# Patient Record
Sex: Male | Born: 1970 | Race: Black or African American | Hispanic: No | Marital: Married | Smoking: Never smoker
Health system: Southern US, Community
[De-identification: ages and names within clinical notes are randomized; demographics above are authoritative.]

## PROBLEM LIST (undated history)

## (undated) DIAGNOSIS — K219 Gastro-esophageal reflux disease without esophagitis: Secondary | ICD-10-CM

## (undated) DIAGNOSIS — H9319 Tinnitus, unspecified ear: Secondary | ICD-10-CM

## (undated) DIAGNOSIS — G4730 Sleep apnea, unspecified: Secondary | ICD-10-CM

---

## 2013-09-18 ENCOUNTER — Ambulatory Visit: Payer: Self-pay | Admitting: Emergency Medicine

## 2016-04-21 ENCOUNTER — Emergency Department
Admission: EM | Admit: 2016-04-21 | Discharge: 2016-04-21 | Disposition: A | Discharge status: Home / Self Care | Attending: Emergency Medicine | Admitting: Emergency Medicine

## 2016-04-21 ENCOUNTER — Emergency Department

## 2016-04-21 ENCOUNTER — Encounter: Payer: Self-pay | Admitting: Emergency Medicine

## 2016-04-21 DIAGNOSIS — H9313 Tinnitus, bilateral: Secondary | ICD-10-CM | POA: Diagnosis present

## 2016-04-21 DIAGNOSIS — G9389 Other specified disorders of brain: Secondary | ICD-10-CM | POA: Insufficient documentation

## 2016-04-21 LAB — CBC WITH DIFFERENTIAL/PLATELET
Basophils Absolute: 0.2 10*3/uL — ABNORMAL HIGH (ref 0–0.1)
Basophils Relative: 2 %
Eosinophils Absolute: 0.1 10*3/uL (ref 0–0.7)
Eosinophils Relative: 2 %
HEMATOCRIT: 46.2 % (ref 40.0–52.0)
HEMOGLOBIN: 15.6 g/dL (ref 13.0–18.0)
LYMPHS ABS: 1.7 10*3/uL (ref 1.0–3.6)
Lymphocytes Relative: 19 %
MCH: 30.9 pg (ref 26.0–34.0)
MCHC: 33.8 g/dL (ref 32.0–36.0)
MCV: 91.5 fL (ref 80.0–100.0)
MONOS PCT: 6 %
Monocytes Absolute: 0.5 10*3/uL (ref 0.2–1.0)
NEUTROS ABS: 6.3 10*3/uL (ref 1.4–6.5)
NEUTROS PCT: 71 %
Platelets: 226 10*3/uL (ref 150–440)
RBC: 5.05 MIL/uL (ref 4.40–5.90)
RDW: 13.8 % (ref 11.5–14.5)
WBC: 8.9 10*3/uL (ref 3.8–10.6)

## 2016-04-21 LAB — COMPREHENSIVE METABOLIC PANEL
ALK PHOS: 94 U/L (ref 38–126)
ALT: 32 U/L (ref 17–63)
AST: 29 U/L (ref 15–41)
Albumin: 4.3 g/dL (ref 3.5–5.0)
Anion gap: 7 (ref 5–15)
BILIRUBIN TOTAL: 0.6 mg/dL (ref 0.3–1.2)
BUN: 13 mg/dL (ref 6–20)
CALCIUM: 9.2 mg/dL (ref 8.9–10.3)
CO2: 28 mmol/L (ref 22–32)
Chloride: 103 mmol/L (ref 101–111)
Creatinine, Ser: 1.22 mg/dL (ref 0.61–1.24)
GFR calc non Af Amer: >60 mL/min (ref >60)
Glucose, Bld: 171 mg/dL — ABNORMAL HIGH (ref 65–99)
Potassium: 4.4 mmol/L (ref 3.5–5.1)
Sodium: 138 mmol/L (ref 135–145)
TOTAL PROTEIN: 8.4 g/dL — AB (ref 6.5–8.1)

## 2016-04-21 LAB — ACETAMINOPHEN LEVEL: Acetaminophen (Tylenol), Serum: <10 ug/mL — ABNORMAL LOW (ref 10–30)

## 2016-04-21 LAB — SALICYLATE LEVEL

## 2016-04-21 NOTE — ED Triage Notes (Addendum)
Pt presents to ED with ringing in his ears for the past several weeks. Pt states his ears "feel real sensitive".

## 2016-04-21 NOTE — Discharge Instructions (Signed)
Please call the number provided to schedule an appointment with the specialist. Return to the ER for worsening symptoms, persistent vomiting, difficulty breathing or other concerns.
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
## 2016-05-02 ENCOUNTER — Emergency Department
Admission: EM | Admit: 2016-05-02 | Discharge: 2016-05-02 | Disposition: A | Discharge status: Home / Self Care | Attending: Emergency Medicine | Admitting: Emergency Medicine

## 2016-05-02 ENCOUNTER — Encounter: Payer: Self-pay | Admitting: Emergency Medicine

## 2016-05-02 DIAGNOSIS — J0100 Acute maxillary sinusitis, unspecified: Secondary | ICD-10-CM | POA: Diagnosis not present

## 2016-05-02 DIAGNOSIS — H73891 Other specified disorders of tympanic membrane, right ear: Secondary | ICD-10-CM | POA: Insufficient documentation

## 2016-05-02 DIAGNOSIS — H9313 Tinnitus, bilateral: Secondary | ICD-10-CM

## 2016-05-02 HISTORY — DX: Sleep apnea, unspecified: G47.30

## 2016-05-02 MED ORDER — AMOXICILLIN-POT CLAVULANATE 875-125 MG PO TABS: 1.0000 | ORAL_TABLET | Freq: Two times a day (BID) | ORAL | 0 refills | Status: AC

## 2016-05-02 MED ORDER — AMOXICILLIN-POT CLAVULANATE 875-125 MG PO TABS
1.0000 | ORAL_TABLET | Freq: Once | ORAL | Status: AC
  Administered 2016-05-02: 1 via ORAL
  Filled 2016-05-02: qty 1

## 2016-05-02 MED ORDER — PREDNISONE 10 MG (21) PO TBPK: 10.0000 mg | ORAL_TABLET | Freq: Every day | ORAL | 0 refills | Status: AC

## 2016-05-02 NOTE — ED Triage Notes (Signed)
Patient states that he started having sinus pressure causing ringing in his ears times two weeks. Patient state that he was seen here for the same about a week ago. Patient states that he has been unable to sleep due to the pain. Patient states that he has no improvement with OTC.

## 2016-05-02 NOTE — ED Provider Notes (Signed)
[facility] Emergency Department Provider Note  ____________________________________________  Time seen: Approximately 9:21 PM  I have reviewed the triage vital signs and the nursing notes.   HISTORY  Chief Complaint Facial Pain and Headache    HPI Rickey Stein is a 46 y.o. male presenting to the emergency department with bilateral tinnitus and maxillary and frontal sinus tenderness for the past three weeks. Patient states that his head feels "full". Patient has a history of seasonal allergies. Patient describes tinnitus as constant and "sounding like crickets". He states that intensity of tinnitus changes throughout the day. Patient denies a history of chronic headaches. He denies head trauma and recurrent otitis media. Patient denies taking any medications chronically. He has only been taking over-the-counter NSAIDs. Patient has been afebrile. He denies rhinorrhea or cough. Patient denies nausea, vomiting and vertigo.    Past Medical History:  Diagnosis Date  . Sleep apnea     There are no active problems to display for this patient.   History reviewed. No pertinent surgical history.  Prior to Admission medications   Medication Sig Start Date End Date Taking? Authorizing Provider  amoxicillin-clavulanate (AUGMENTIN) 875-125 MG tablet Take 1 tablet by mouth 2 (two) times daily. 05/02/16 05/12/16  Orvil Feil, PA-C  predniSONE (STERAPRED UNI-PAK 21 TAB) 10 MG (21) TBPK tablet Take 1 tablet (10 mg total) by mouth daily. Take 6 tablets on the first day. Take 5 tablets on the second day. Take 4 tablets on the third day. Take 3 tablets on the fourth day. Take 2 tablets on the fifth day. Take 1 tablet on the 6 day. 05/02/16   Orvil Feil, PA-C    Allergies Patient has no known allergies.  No family history on file.  Social History Social History  Substance Use Topics  . Smoking status: Never Smoker  . Smokeless tobacco: Never Used  . Alcohol use No      Review of Systems  Constitutional: No fever/chills Eyes: No visual changes. No discharge ENT: Patient has maxillary and frontal sinus tenderness. Patient has bilateral tinnitus.  Cardiovascular: no chest pain. Respiratory: no cough. No SOB. Gastrointestinal: No abdominal pain.  No nausea, no vomiting.  No diarrhea.  No constipation. Skin: Negative for rash, abrasions, lacerations, ecchymosis. Neurological: Negative for  focal weakness or numbness. 10-point ROS otherwise negative.  ____________________________________________   PHYSICAL EXAM:  VITAL SIGNS: ED Triage Vitals  Enc Vitals Group     BP 05/02/16 2043 131/78     Pulse Rate 05/02/16 2043 84     Resp 05/02/16 2043 18     Temp 05/02/16 2043 97.9 F (36.6 C)     Temp Source 05/02/16 2043 Oral     SpO2 05/02/16 2043 98 %     Weight 05/02/16 2043 280 lb (127 kg)     Height 05/02/16 2043 5\' 11"  (1.803 m)     Head Circumference --      Peak Flow --      Pain Score 05/02/16 2044 9     Pain Loc --      Pain Edu? --      Excl. in GC? --     Constitutional: Alert and oriented. Patient is talkative and engaged.  Eyes: Palpebral and bulbar conjunctiva are nonerythematous bilaterally. PERRL. EOMI. No scleral icterus bilaterally. Head: Atraumatic. Patient has maxillary and frontal sinus tenderness. ENT:      Ears: Middle ears are grossly effused bilaterally. No evidence of erythema or purulent exudate.  Bony landmarks are visualized bilaterally.      Nose: Skin overlying nares is without erythema. Nasal turbinates are mildly erythematous. Nasal septum is midline.      Mouth/Throat: Mucous membranes are moist. Posterior pharynx is nonerythematous. No tonsillar exudate, hypertrophy or petechiae visualized. Uvula is midline. Neck: Full range of motion. No pain with neck flexion. Hematological/Lymphatic/Immunilogical: No cervical lymphadenopathy.  Cardiovascular:Normal rate, regular rhythm. Normal S1 and S2. No murmurs,  gallops or rubs auscultated.  Respiratory: Trachea is midline. No retractions or presence of deformity.  On auscultation, adventitious sounds are absent.  Neurologic:  Normal for age. No gross focal neurologic deficits are appreciated.  Skin:  Skin is warm, dry and intact. No rash noted.  Psychiatric: Mood and affect are normal for age. Speech and behavior are normal.    ____________________________________________   LABS (all labs ordered are listed, but only abnormal results are displayed)  Labs Reviewed - No data to display ____________________________________________  EKG   ____________________________________________  RADIOLOGY   No results found.  ____________________________________________    PROCEDURES  Procedure(s) performed:    Procedures    Medications  amoxicillin-clavulanate (AUGMENTIN) 875-125 MG per tablet 1 tablet (1 tablet Oral Given 05/02/16 2145)     ____________________________________________   INITIAL IMPRESSION / ASSESSMENT AND PLAN / ED COURSE  Pertinent labs & imaging results that were available during my care of the patient were reviewed by me and considered in my medical decision making (see chart for details).  Review of the [place] CSRS was performed in accordance of the [facility] prior to dispensing any controlled drugs.  Clinical Course    Assessment and Plan:  Tinnitus  Patient presents to the emergency department with bilateral tinnitus. The only potential ototoxic medication that patient has taken is NSAIDs. Patient was advised to discontinue taking NSAIDs of any kind. Patient has middle ear effusion bilaterally on physical exam. In addition, patient has frontal maxillary sinus tenderness. Patient was started on tapered prednisone and augmentin. A combination of chronic sinusitis and middle ear effusion are likely the source of patient's symptoms. Patient was advised to follow-up with otolaryngology in 10 days if tinnitus persists. Vital  signs are reassuring at this time. All patient questions were answered.  ____________________________________________  FINAL CLINICAL IMPRESSION(S) / ED DIAGNOSES  Final diagnoses:  Tinnitus of right ear  Fluid level behind tympanic membrane of right ear  Acute non-recurrent maxillary sinusitis      NEW MEDICATIONS STARTED DURING THIS VISIT:  Discharge Medication List as of 05/02/2016  9:41 PM    START taking these medications   Details  amoxicillin-clavulanate (AUGMENTIN) 875-125 MG tablet Take 1 tablet by mouth 2 (two) times daily., Starting Sun 05/02/2016, Until Wed 05/12/2016, Print    predniSONE (STERAPRED UNI-PAK 21 TAB) 10 MG (21) TBPK tablet Take 1 tablet (10 mg total) by mouth daily. Take 6 tablets on the first day. Take 5 tablets on the second day. Take 4 tablets on the third day. Take 3 tablets on the fourth day. Take 2 tablets on the fifth day. Take 1 tablet on the 6 day., Starting Sun 1 /10/2016, Print            This chart was dictated using voice recognition software/Dragon. Despite best efforts to proofread, errors can occur which can change the meaning. Any change was purely unintentional.    Orvil FeilJaclyn M Woods, PA-C 05/02/16 2205    Governor Rooksebecca Lord, MD 05/05/16 [phone]

## 2016-05-02 NOTE — ED Notes (Signed)
Pt reports headache and ringing in the ears for the past 2 weeks - c/o insomnia - denies nausea/vomiting - denies dizziness - states it is hard to focus

## 2018-03-11 ENCOUNTER — Emergency Department

## 2018-03-11 ENCOUNTER — Other Ambulatory Visit: Payer: Self-pay

## 2018-03-11 ENCOUNTER — Emergency Department
Admission: EM | Admit: 2018-03-11 | Discharge: 2018-03-11 | Disposition: A | Discharge status: Home / Self Care | Attending: Emergency Medicine | Admitting: Emergency Medicine

## 2018-03-11 DIAGNOSIS — K2970 Gastritis, unspecified, without bleeding: Secondary | ICD-10-CM | POA: Diagnosis not present

## 2018-03-11 DIAGNOSIS — R1012 Left upper quadrant pain: Secondary | ICD-10-CM | POA: Diagnosis present

## 2018-03-11 HISTORY — DX: Gastro-esophageal reflux disease without esophagitis: K21.9

## 2018-03-11 HISTORY — DX: Tinnitus, unspecified ear: H93.19

## 2018-03-11 LAB — HEPATIC FUNCTION PANEL
ALT: 238 U/L — ABNORMAL HIGH (ref 0–44)
AST: 327 U/L — AB (ref 15–41)
Albumin: 4.3 g/dL (ref 3.5–5.0)
Alkaline Phosphatase: 109 U/L (ref 38–126)
Bilirubin, Direct: 0.6 mg/dL — ABNORMAL HIGH (ref 0.0–0.2)
Indirect Bilirubin: 1.4 mg/dL — ABNORMAL HIGH (ref 0.3–0.9)
TOTAL PROTEIN: 7.8 g/dL (ref 6.5–8.1)
Total Bilirubin: 2 mg/dL — ABNORMAL HIGH (ref 0.3–1.2)

## 2018-03-11 LAB — CBC
HCT: 44.8 % (ref 39.0–52.0)
Hemoglobin: 15.1 g/dL (ref 13.0–17.0)
MCH: 30.6 pg (ref 26.0–34.0)
MCHC: 33.7 g/dL (ref 30.0–36.0)
MCV: 90.9 fL (ref 80.0–100.0)
Platelets: 230 10*3/uL (ref 150–400)
RBC: 4.93 MIL/uL (ref 4.22–5.81)
RDW: 12.5 % (ref 11.5–15.5)
WBC: 9.3 10*3/uL (ref 4.0–10.5)
nRBC: 0 % (ref 0.0–0.2)

## 2018-03-11 LAB — BASIC METABOLIC PANEL
Anion gap: 9 (ref 5–15)
BUN: 13 mg/dL (ref 6–20)
CALCIUM: 9.3 mg/dL (ref 8.9–10.3)
CO2: 24 mmol/L (ref 22–32)
CREATININE: 1 mg/dL (ref 0.61–1.24)
Chloride: 107 mmol/L (ref 98–111)
GFR calc Af Amer: >60 mL/min (ref >60)
GFR calc non Af Amer: >60 mL/min (ref >60)
GLUCOSE: 110 mg/dL — AB (ref 70–99)
Potassium: 3.8 mmol/L (ref 3.5–5.1)
Sodium: 140 mmol/L (ref 135–145)

## 2018-03-11 LAB — LIPASE, BLOOD: LIPASE: 33 U/L (ref 11–51)

## 2018-03-11 LAB — TROPONIN I: Troponin I: <0.03 ng/mL (ref <0.03)

## 2018-03-11 MED ORDER — SUCRALFATE 1 G PO TABS: 1.0000 g | ORAL_TABLET | Freq: Four times a day (QID) | ORAL | 0 refills | Status: AC

## 2018-03-11 NOTE — Discharge Instructions (Addendum)
Please seek medical attention for any high fevers, chest pain, shortness of breath, change in behavior, persistent vomiting, bloody stool or any other new or concerning symptoms.  

## 2018-03-11 NOTE — ED Triage Notes (Signed)
Pt states ate spicy soup yesterday and is have reflux. Hx of reflux and takes a pill once a day for it. Unsure what it is. A&O, ambulatory.

## 2018-03-11 NOTE — ED Provider Notes (Addendum)
[facility] Emergency Department Provider Note   ____________________________________________   I have reviewed the triage vital signs and the nursing notes.   HISTORY  Chief Complaint Gastroesophageal Reflux   History limited by: Not Limited   HPI Rickey Stein is a 47 y.o. male who presents to the emergency department today because of concerns for bad acid reflux.  The patient states that he has a history of having problems with acid reflux.  He does take omeprazole although takes it somewhat irregularly.  The patient states that he thinks that it might of been some soup that he had last night that made it worse.  The pain is located primarily in his epigastric region.  By the time my exam he states his pain was better and that he taken omeprazole roughly 3 hours prior to my exam.  Patient had some vomiting with this although it was nonbloody.  He has not noticed any bloody or black stool.   Per medical record review patient has a history of acid reflux  Past Medical History:  Diagnosis Date  . Acid reflux   . Sleep apnea   . Tinnitus     There are no active problems to display for this patient.   History reviewed. No pertinent surgical history.  Prior to Admission medications   Medication Sig Start Date End Date Taking? Authorizing Provider  predniSONE (STERAPRED UNI-PAK 21 TAB) 10 MG (21) TBPK tablet Take 1 tablet (10 mg total) by mouth daily. Take 6 tablets on the first day. Take 5 tablets on the second day. Take 4 tablets on the third day. Take 3 tablets on the fourth day. Take 2 tablets on the fifth day. Take 1 tablet on the 6 day. 05/02/16   Orvil Feil, PA-C    Allergies Patient has no known allergies.  History reviewed. No pertinent family history.  Social History Social History   Tobacco Use  . Smoking status: Never Smoker  . Smokeless tobacco: Never Used  Substance Use Topics  . Alcohol use: No  . Drug use: No    Review of  Systems Constitutional: No fever/chills Eyes: No visual changes. ENT: No sore throat. Cardiovascular: Denies chest pain. Respiratory: Denies shortness of breath. Gastrointestinal: Positive for abdominal pain, vomiting.  Genitourinary: Negative for dysuria. Musculoskeletal: Negative for back pain. Skin: Negative for rash. Neurological: Negative for headaches, focal weakness or numbness.  ____________________________________________   PHYSICAL EXAM:  VITAL SIGNS: ED Triage Vitals  Enc Vitals Group     BP 03/11/18 1718 133/88     Pulse Rate 03/11/18 1716 75     Resp 03/11/18 1716 16     Temp 03/11/18 1716 98.6 F (37 C)     Temp Source 03/11/18 1716 Oral     SpO2 03/11/18 1716 96 %     Weight 03/11/18 1717 275 lb (124.7 kg)     Height 03/11/18 1717 5\' 11"  (1.803 m)     Head Circumference --      Peak Flow --      Pain Score 03/11/18 1717 9   Constitutional: Alert and oriented.  Eyes: Conjunctivae are normal.  ENT      Head: Normocephalic and atraumatic.      Nose: No congestion/rhinnorhea.      Mouth/Throat: Mucous membranes are moist.      Neck: No stridor. Hematological/Lymphatic/Immunilogical: No cervical lymphadenopathy. Cardiovascular: Normal rate, regular rhythm.  No murmurs, rubs, or gallops.  Respiratory: Normal respiratory effort without tachypnea  nor retractions. Breath sounds are clear and equal bilaterally. No wheezes/rales/rhonchi. Gastrointestinal: Soft and minimally tender in the epigastric and left upper quadrants.  Genitourinary: Deferred Musculoskeletal: Normal range of motion in all extremities. No lower extremity edema. Neurologic:  Normal speech and language. No gross focal neurologic deficits are appreciated.  Skin:  Skin is warm, dry and intact. No rash noted. Psychiatric: Mood and affect are normal. Speech and behavior are normal. Patient exhibits appropriate insight and judgment.  ____________________________________________    LABS  (pertinent positives/negatives)  Trop <0.03 CBC wbc 9.3, hgb 15.1, plt 230 BMP wnl except glu 110  ____________________________________________   EKG  I, Phineas SemenGraydon Raidon Swanner, attending physician, personally viewed and interpreted this EKG  EKG Time: 1721 Rate: 75 Rhythm: normal sinus rhythm Axis: normal Intervals: qtc 410 QRS: narrow ST changes: no st elevation Impression: normal ekg   ____________________________________________    RADIOLOGY  CXR No acute disease  ____________________________________________   PROCEDURES  Procedures  ____________________________________________   INITIAL IMPRESSION / ASSESSMENT AND PLAN / ED COURSE  Pertinent labs & imaging results that were available during my care of the patient were reviewed by me and considered in my medical decision making (see chart for details).   Patient presented to the emergency department today because of concerns for possible acid reflux.  On exam patient did have some tenderness in the epigastric region.  Blood work without concerning leukocytosis.  Patient's pain had resolved by the time my exam.  He was however slightly tender in the epigastric and left upper quadrants.  At this point I think acid reflux likely. Discussed this with the patient. Will plan on discharging with sucralfate.  LFTs resulted after patient was discharged. I was able to contact the patient over the phone and relayed the results. Discussed with patient importance of LFT recheck with PCP and possible gallbladder US if symptoms continue.  ____________________________________________   FINAL CLINICAL IMPRESSION(S) / ED DIAGNOSES  Final diagnoses:  Gastritis, presence of bleeding unspecified, unspecified chronicity, unspecified gastritis type     Note: This dictation was prepared with Dragon dictation. Any transcriptional errors that result from this process are unintentional     Phineas SemenGoodman, Lamoine Fredricksen, MD 03/11/18 2050     Phineas SemenGoodman, Sascha Baugher, MD 03/11/18 2211

## 2018-07-27 IMAGING — CT CT HEAD W/O CM
3 series · 16 of 47 positions shown, 19 images · non-contrast
Comparison: Head CT 09/18/2013

CLINICAL DATA: Tinnitus

EXAM:
CT HEAD WITHOUT CONTRAST
TECHNIQUE: Contiguous axial images were obtained from the base of the skull
through the vertex without intravenous contrast.

[Series 3: head wo · axial · 0.45mm/px · z∈[-89,+36]mm · 10 of 31 slices shown, 13 images]
[im 3/31  brain]
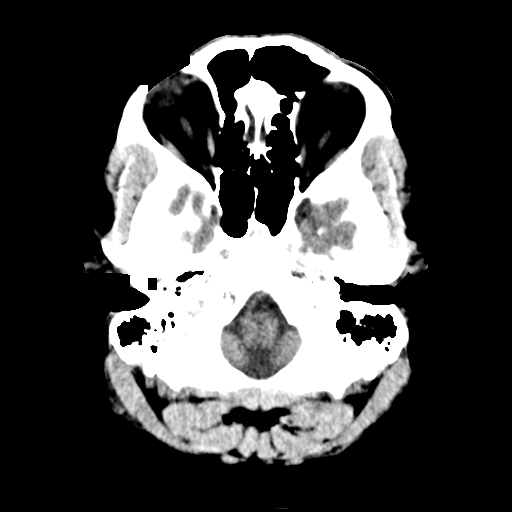
[im 3/31  bone]
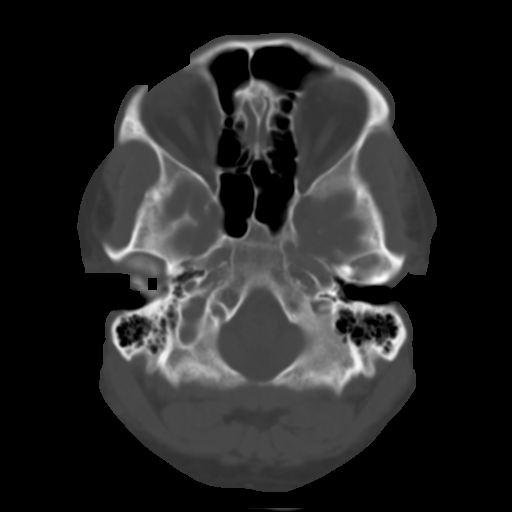
[im 6/31  brain]
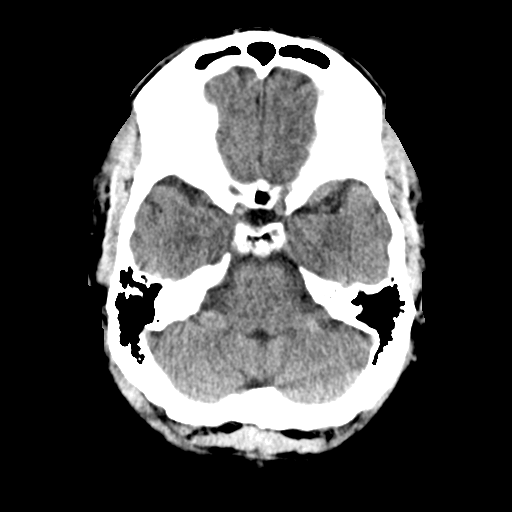
[im 9/31  brain]
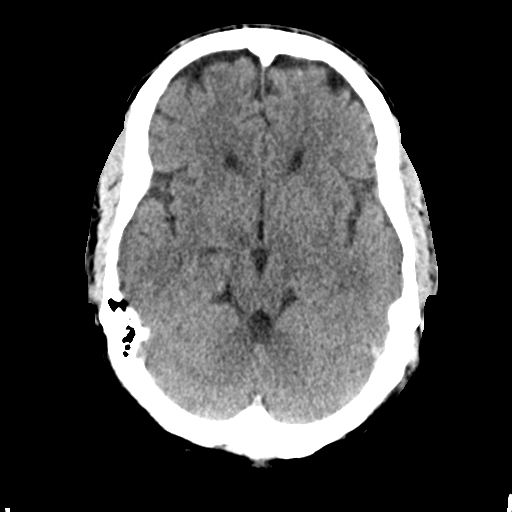
[im 11/31  brain]
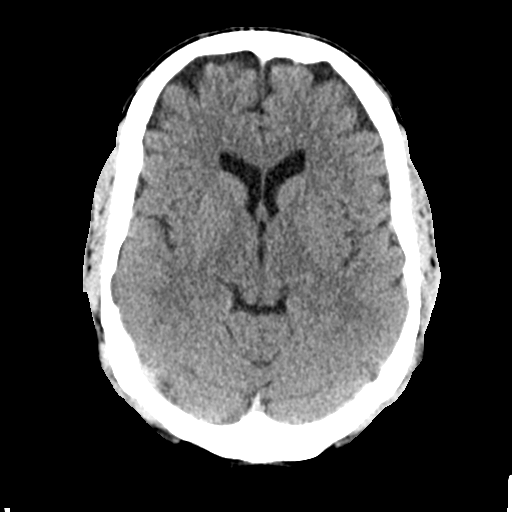
[im 14/31  brain]
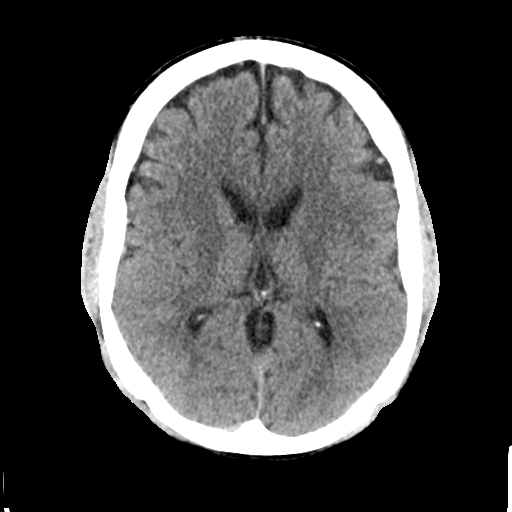
[im 14/31  bone]
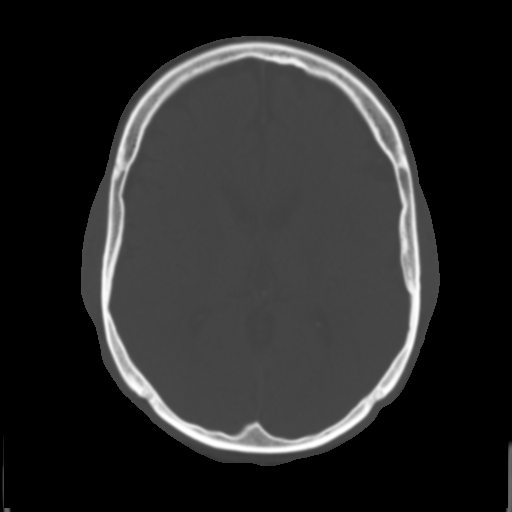
[im 17/31  brain]
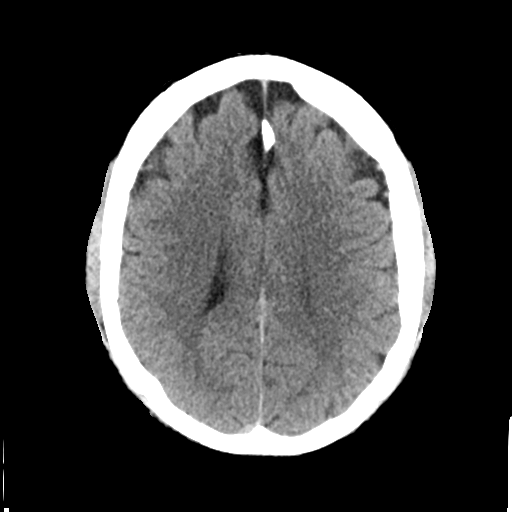
[im 20/31  brain]
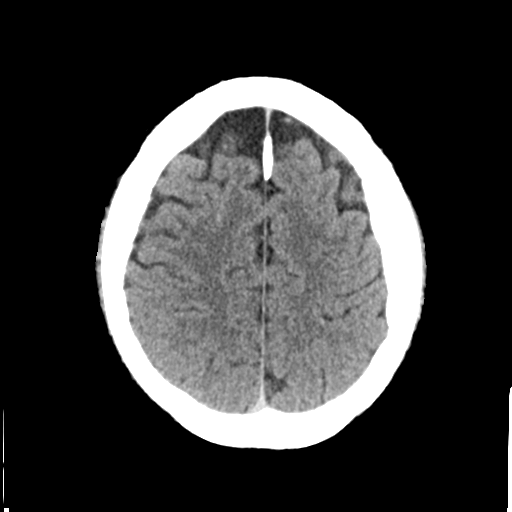
[im 23/31  brain]
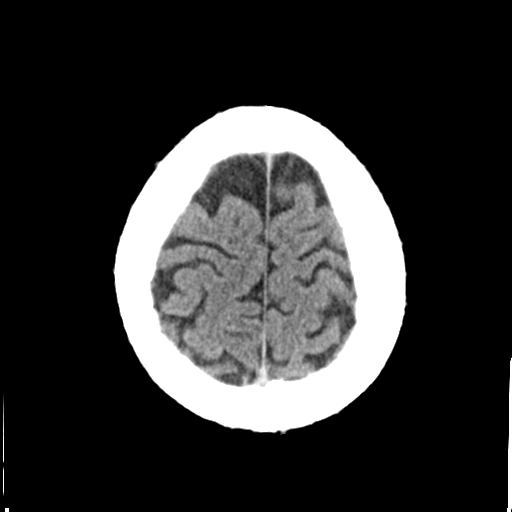
[im 25/31  brain]
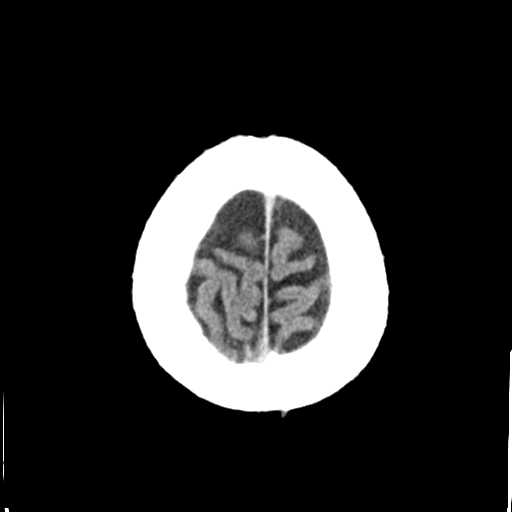
[im 25/31  bone]
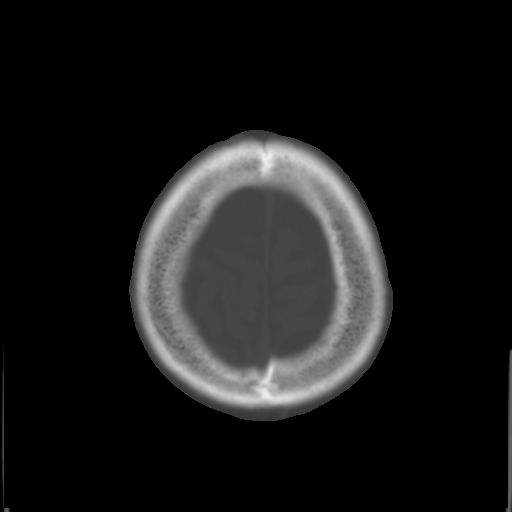
[im 28/31  brain]
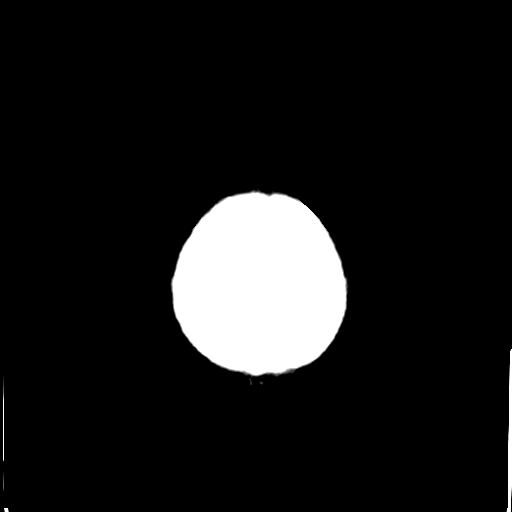

[Series 4: coronal soft tissue · coronal · 0.31mm/px · 3 of 67 slices shown]
[im 23/67  brain]
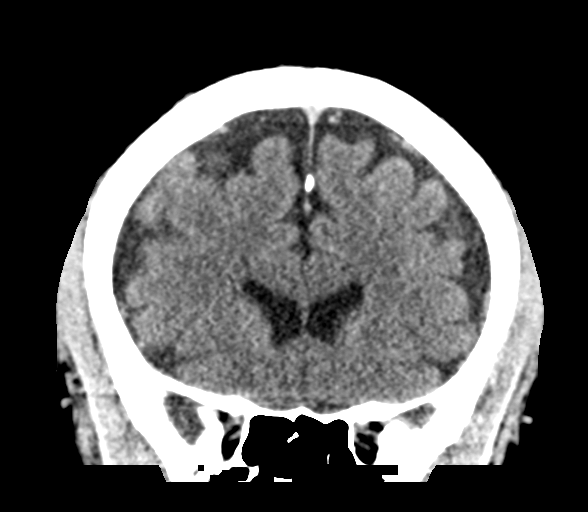
[im 30/67  brain]
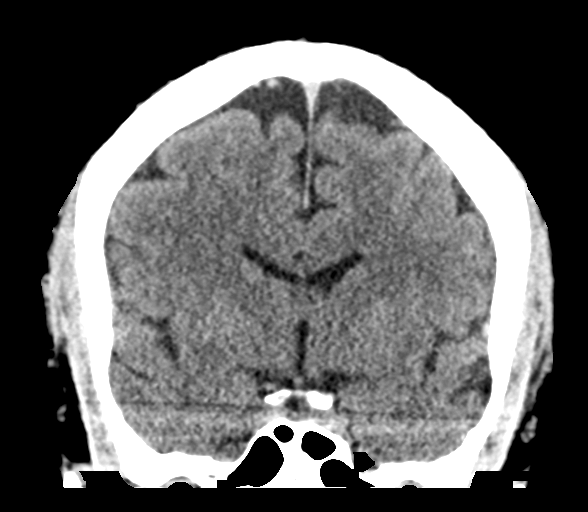
[im 37/67  brain]
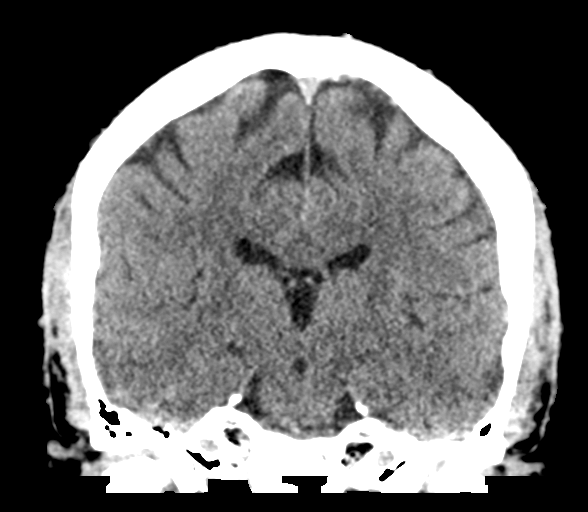

[Series 5: sagittal soft tissue · sagittal · 0.33mm/px · 3 of 60 slices shown]
[im 20/60  brain]
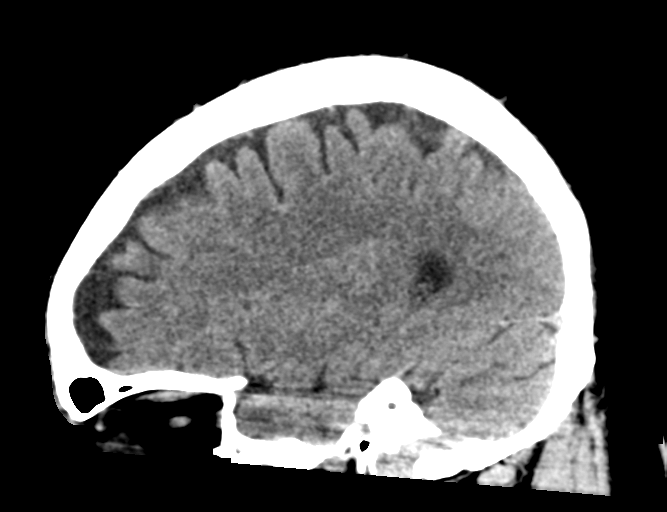
[im 30/60  brain]
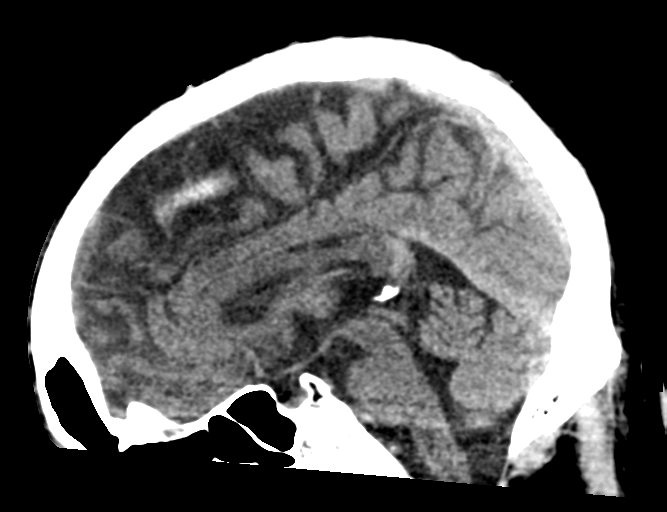
[im 40/60  brain]
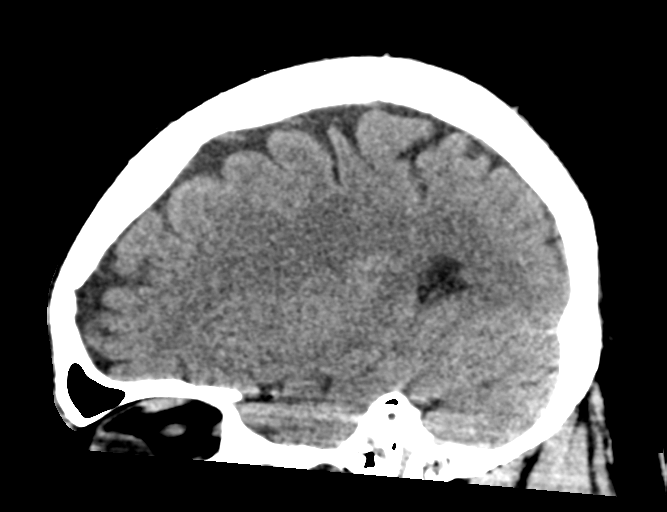

[16 of 47 positions shown; findings below may reference images not displayed]

FINDINGS: Brain: No mass lesion, intraparenchymal hemorrhage or extra-axial
collection. No evidence of acute cortical infarct. Brain parenchyma
and CSF-containing spaces are normal for age.

Vascular: No hyperdense vessel or unexpected calcification.

Skull: Normal visualized skull base, calvarium and extracranial soft
tissues.

Sinuses/Orbits: No sinus fluid levels or advanced mucosal
thickening. No mastoid effusion. Normal orbits.
IMPRESSION: Normal head CT.

## 2020-06-15 IMAGING — CR DG CHEST 2V
1 series · 2 of 2 positions shown · non-contrast
Comparison: None.

CLINICAL DATA: Reflux

EXAM:
CHEST - 2 VIEW

[Series 1: dg chest 2 view · 0.14mm/px · 2 of 2 slices shown]
[im 1/2]
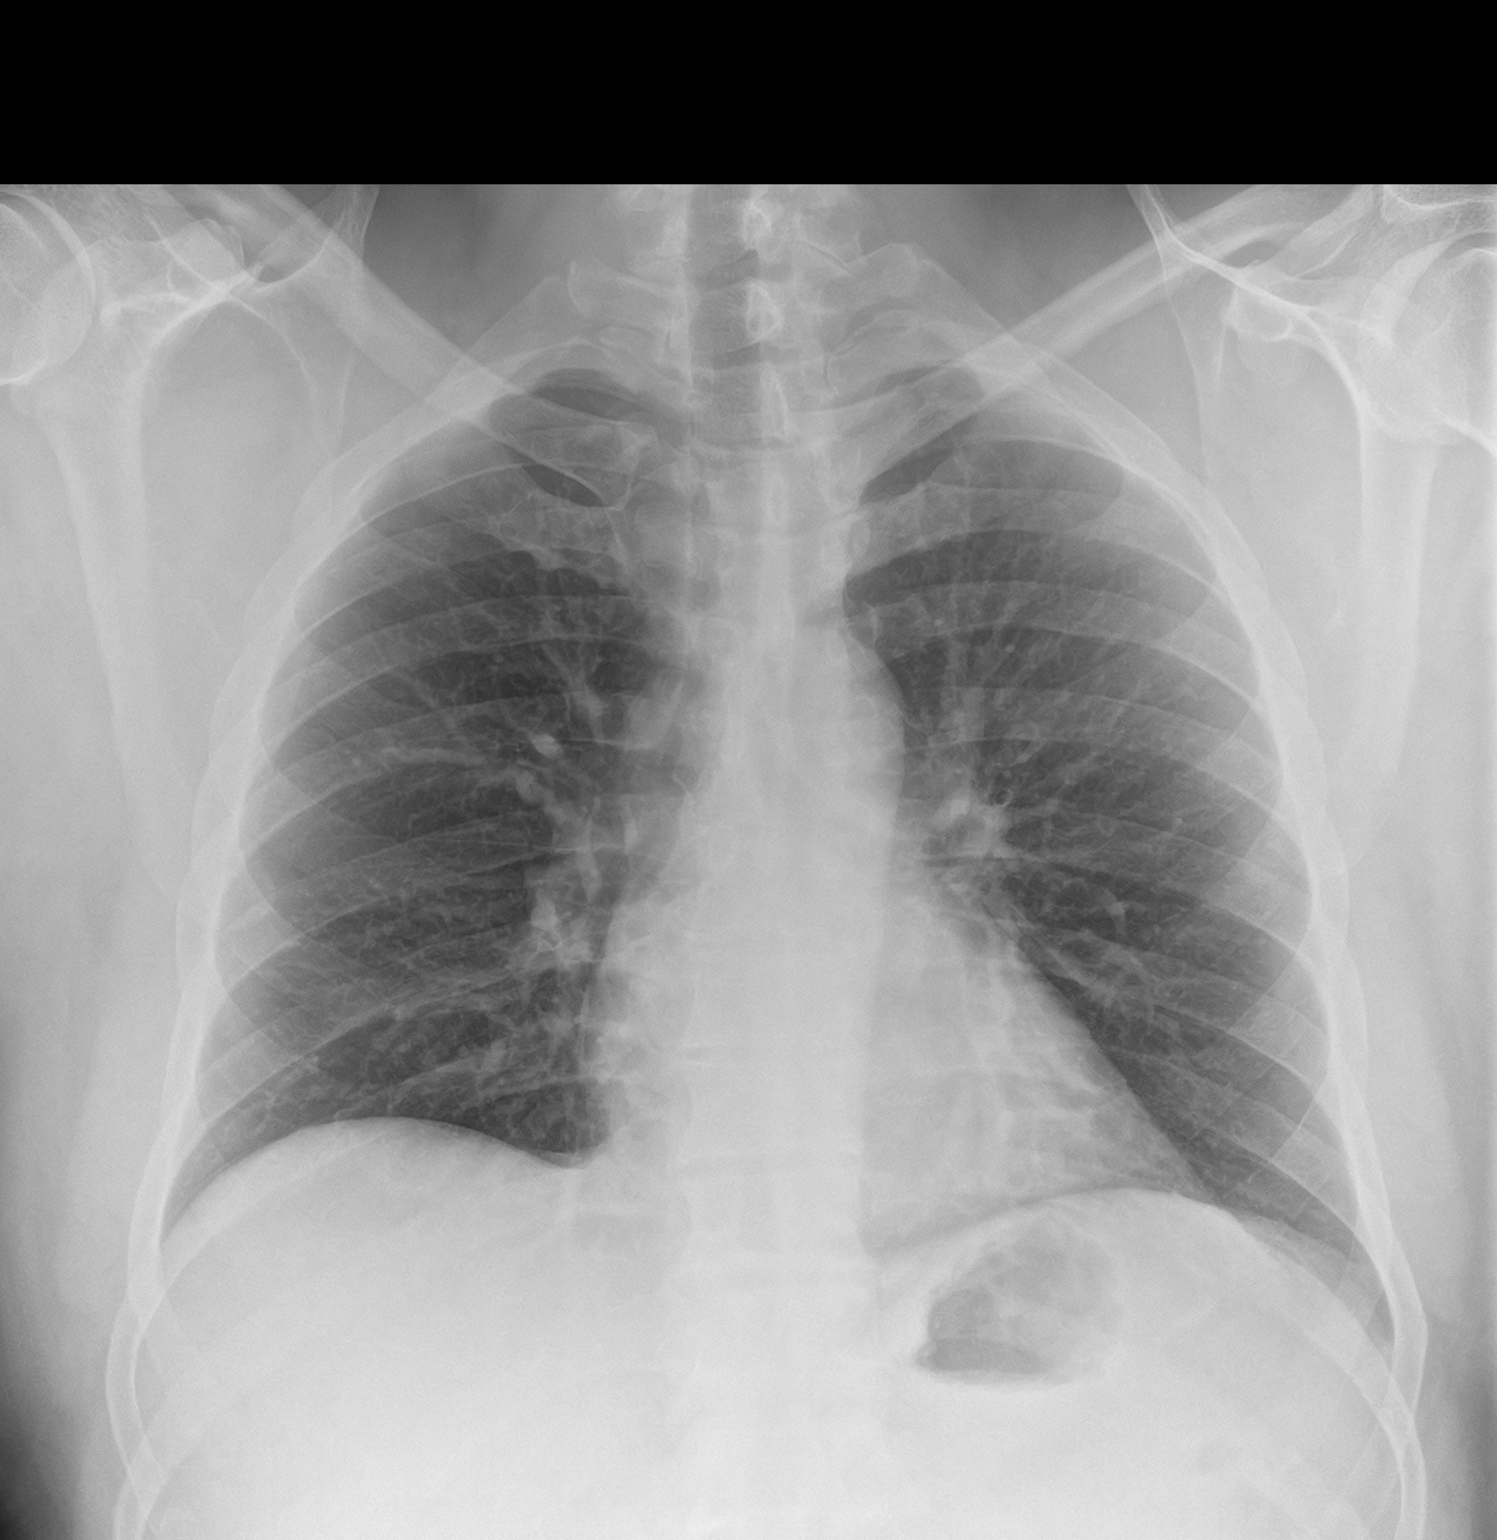
[im 2/2]
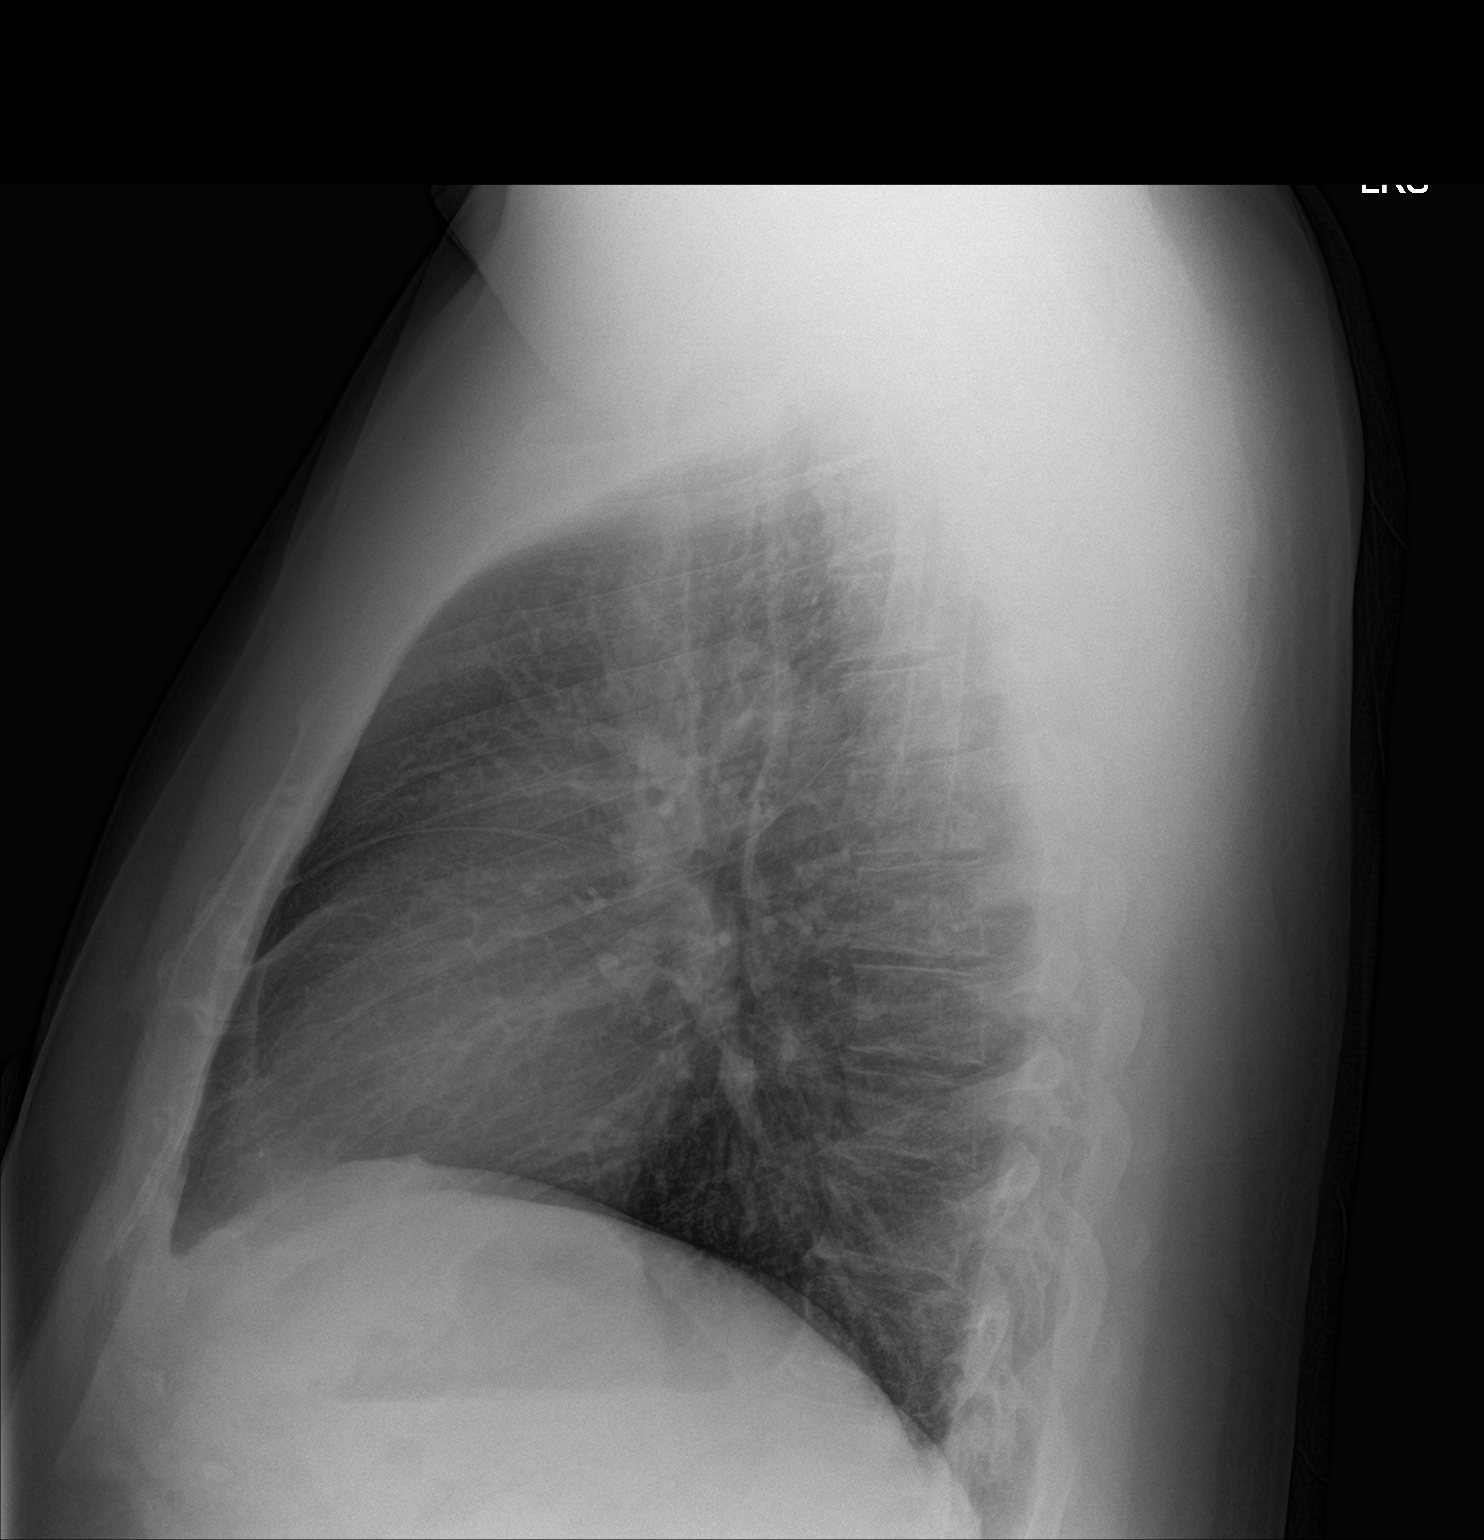

[2 of 2 positions shown; findings below may reference images not displayed]

FINDINGS: Lungs are clear.  No pleural effusion or pneumothorax.

The heart is normal in size.

Visualized osseous structures are within normal limits.
IMPRESSION: Normal chest radiographs.
